# Patient Record
Sex: Female | Born: 2000
Health system: Southern US, Community
[De-identification: ages and names within clinical notes are randomized; demographics above are authoritative.]

## PROBLEM LIST (undated history)

## (undated) DIAGNOSIS — F419 Anxiety disorder, unspecified: Secondary | ICD-10-CM

## (undated) DIAGNOSIS — R519 Headache, unspecified: Secondary | ICD-10-CM

## (undated) DIAGNOSIS — R51 Headache: Secondary | ICD-10-CM

---
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
## 2019-10-22 ENCOUNTER — Other Ambulatory Visit: Payer: Self-pay

## 2019-10-22 DIAGNOSIS — Z20822 Contact with and (suspected) exposure to covid-19: Secondary | ICD-10-CM

## 2019-10-23 LAB — NOVEL CORONAVIRUS, NAA

## 2019-11-09 ENCOUNTER — Emergency Department (HOSPITAL_BASED_OUTPATIENT_CLINIC_OR_DEPARTMENT_OTHER)
Admission: EM | Admit: 2019-11-09 | Discharge: 2019-11-09 | Disposition: A | Discharge status: Home / Self Care | Payer: No Typology Code available for payment source | Attending: Emergency Medicine | Admitting: Emergency Medicine

## 2019-11-09 ENCOUNTER — Encounter (HOSPITAL_BASED_OUTPATIENT_CLINIC_OR_DEPARTMENT_OTHER): Payer: Self-pay

## 2019-11-09 ENCOUNTER — Emergency Department (HOSPITAL_BASED_OUTPATIENT_CLINIC_OR_DEPARTMENT_OTHER): Payer: No Typology Code available for payment source

## 2019-11-09 ENCOUNTER — Other Ambulatory Visit: Payer: Self-pay

## 2019-11-09 DIAGNOSIS — Z79899 Other long term (current) drug therapy: Secondary | ICD-10-CM | POA: Insufficient documentation

## 2019-11-09 DIAGNOSIS — R112 Nausea with vomiting, unspecified: Secondary | ICD-10-CM | POA: Diagnosis present

## 2019-11-09 DIAGNOSIS — K2900 Acute gastritis without bleeding: Secondary | ICD-10-CM | POA: Diagnosis not present

## 2019-11-09 DIAGNOSIS — R1084 Generalized abdominal pain: Secondary | ICD-10-CM

## 2019-11-09 LAB — POCT I-STAT EG7
Acid-Base Excess: 1 mmol/L (ref 0.0–2.0)
Bicarbonate: 23 mmol/L (ref 20.0–28.0)
Calcium, Ion: 1.24 mmol/L (ref 1.15–1.40)
HCT: 40 % (ref 36.0–46.0)
Hemoglobin: 13.6 g/dL (ref 12.0–15.0)
O2 Saturation: 54 %
Potassium: 3.1 mmol/L — ABNORMAL LOW (ref 3.5–5.1)
Sodium: 141 mmol/L (ref 135–145)
TCO2: 24 mmol/L (ref 22–32)
pCO2, Ven: 29.9 mmHg — ABNORMAL LOW (ref 44.0–60.0)
pH, Ven: 7.494 — ABNORMAL HIGH (ref 7.250–7.430)
pO2, Ven: 26 mmHg — CL (ref 32.0–45.0)

## 2019-11-09 LAB — LACTIC ACID, PLASMA: Lactic Acid, Venous: 1.3 mmol/L (ref 0.5–1.9)

## 2019-11-09 LAB — BASIC METABOLIC PANEL
Anion gap: 10 (ref 5–15)
BUN: 6 mg/dL (ref 6–20)
CO2: 24 mmol/L (ref 22–32)
Calcium: 8.7 mg/dL — ABNORMAL LOW (ref 8.9–10.3)
Chloride: 104 mmol/L (ref 98–111)
Creatinine, Ser: 0.59 mg/dL (ref 0.44–1.00)
GFR calc Af Amer: >60 mL/min (ref >60)
GFR calc non Af Amer: >60 mL/min (ref >60)
Glucose, Bld: 90 mg/dL (ref 70–99)
Potassium: 3.5 mmol/L (ref 3.5–5.1)
Sodium: 138 mmol/L (ref 135–145)

## 2019-11-09 LAB — COMPREHENSIVE METABOLIC PANEL
ALT: 17 U/L (ref 0–44)
AST: 19 U/L (ref 15–41)
Albumin: 4.8 g/dL (ref 3.5–5.0)
Alkaline Phosphatase: 49 U/L (ref 38–126)
Anion gap: 12 (ref 5–15)
BUN: 7 mg/dL (ref 6–20)
CO2: 23 mmol/L (ref 22–32)
Calcium: 9.7 mg/dL (ref 8.9–10.3)
Chloride: 104 mmol/L (ref 98–111)
Creatinine, Ser: 0.76 mg/dL (ref 0.44–1.00)
GFR calc Af Amer: >60 mL/min (ref >60)
GFR calc non Af Amer: >60 mL/min (ref >60)
Glucose, Bld: 109 mg/dL — ABNORMAL HIGH (ref 70–99)
Potassium: 3.1 mmol/L — ABNORMAL LOW (ref 3.5–5.1)
Sodium: 139 mmol/L (ref 135–145)
Total Bilirubin: 0.8 mg/dL (ref 0.3–1.2)
Total Protein: 7.9 g/dL (ref 6.5–8.1)

## 2019-11-09 LAB — CBC WITH DIFFERENTIAL/PLATELET
Abs Immature Granulocytes: 0.03 10*3/uL (ref 0.00–0.07)
Basophils Absolute: 0 10*3/uL (ref 0.0–0.1)
Basophils Relative: 0 %
Eosinophils Absolute: 0 10*3/uL (ref 0.0–0.5)
Eosinophils Relative: 0 %
HCT: 38.5 % (ref 36.0–46.0)
Hemoglobin: 13.4 g/dL (ref 12.0–15.0)
Immature Granulocytes: 0 %
Lymphocytes Relative: 11 %
Lymphs Abs: 1.1 10*3/uL (ref 0.7–4.0)
MCH: 30.7 pg (ref 26.0–34.0)
MCHC: 34.8 g/dL (ref 30.0–36.0)
MCV: 88.1 fL (ref 80.0–100.0)
Monocytes Absolute: 0.2 10*3/uL (ref 0.1–1.0)
Monocytes Relative: 2 %
Neutro Abs: 8.6 10*3/uL — ABNORMAL HIGH (ref 1.7–7.7)
Neutrophils Relative %: 87 %
Platelets: 243 10*3/uL (ref 150–400)
RBC: 4.37 MIL/uL (ref 3.87–5.11)
RDW: 11.5 % (ref 11.5–15.5)
WBC: 10 10*3/uL (ref 4.0–10.5)
nRBC: 0 % (ref 0.0–0.2)

## 2019-11-09 LAB — URINALYSIS, MICROSCOPIC (REFLEX)

## 2019-11-09 LAB — RAPID URINE DRUG SCREEN, HOSP PERFORMED
Amphetamines: NOT DETECTED
Barbiturates: NOT DETECTED
Benzodiazepines: NOT DETECTED
Cocaine: NOT DETECTED
Opiates: NOT DETECTED
Tetrahydrocannabinol: POSITIVE — AB

## 2019-11-09 LAB — URINALYSIS, ROUTINE W REFLEX MICROSCOPIC
Bilirubin Urine: NEGATIVE
Glucose, UA: NEGATIVE mg/dL
Ketones, ur: >80 mg/dL — AB
Leukocytes,Ua: NEGATIVE
Nitrite: NEGATIVE
Protein, ur: NEGATIVE mg/dL
Specific Gravity, Urine: 1.02 (ref 1.005–1.030)
pH: 7.5 (ref 5.0–8.0)

## 2019-11-09 LAB — MAGNESIUM: Magnesium: 1.7 mg/dL (ref 1.7–2.4)

## 2019-11-09 LAB — LIPASE, BLOOD: Lipase: 23 U/L (ref 11–51)

## 2019-11-09 LAB — GROUP A STREP BY PCR: Group A Strep by PCR: NOT DETECTED

## 2019-11-09 LAB — TSH: TSH: 0.711 u[IU]/mL (ref 0.350–4.500)

## 2019-11-09 LAB — HCG, QUANTITATIVE, PREGNANCY: hCG, Beta Chain, Quant, S: <1 m[IU]/mL (ref <5)

## 2019-11-09 LAB — PHOSPHORUS: Phosphorus: 3.6 mg/dL (ref 2.5–4.6)

## 2019-11-09 MED ORDER — CAPSAICIN 0.025 % EX CREA
TOPICAL_CREAM | Freq: Once | CUTANEOUS | Status: AC
  Administered 2019-11-09: 1 via TOPICAL
  Filled 2019-11-09: qty 60

## 2019-11-09 MED ORDER — LACTATED RINGERS IV BOLUS
2000.0000 mL | Freq: Once | INTRAVENOUS | Status: AC
  Administered 2019-11-09: 2000 mL via INTRAVENOUS

## 2019-11-09 MED ORDER — IOHEXOL 300 MG/ML  SOLN
100.0000 mL | Freq: Once | INTRAMUSCULAR | Status: AC | PRN
  Administered 2019-11-09: 100 mL via INTRAVENOUS

## 2019-11-09 MED ORDER — POTASSIUM PHOSPHATES 15 MMOLE/5ML IV SOLN
15.0000 mmol | Freq: Once | INTRAVENOUS | Status: DC
  Filled 2019-11-09: qty 5

## 2019-11-09 MED ORDER — FAMOTIDINE 20 MG PO TABS: 20.0000 mg | ORAL_TABLET | Freq: Two times a day (BID) | ORAL | 0 refills | Status: DC

## 2019-11-09 MED ORDER — DIPHENHYDRAMINE HCL 50 MG/ML IJ SOLN
25.0000 mg | Freq: Once | INTRAMUSCULAR | Status: AC
  Administered 2019-11-09: 12:00:00 25 mg via INTRAVENOUS
  Filled 2019-11-09: qty 1

## 2019-11-09 MED ORDER — SODIUM CHLORIDE 0.9 % IV SOLN: INTRAVENOUS | Status: DC | PRN

## 2019-11-09 MED ORDER — K PHOS MONO-SOD PHOS DI & MONO 155-852-130 MG PO TABS
500.0000 mg | ORAL_TABLET | Freq: Once | ORAL | Status: DC
  Filled 2019-11-09: qty 2

## 2019-11-09 MED ORDER — PROCHLORPERAZINE EDISYLATE 10 MG/2ML IJ SOLN
10.0000 mg | Freq: Once | INTRAMUSCULAR | Status: AC
  Administered 2019-11-09: 10 mg via INTRAVENOUS
  Filled 2019-11-09: qty 2

## 2019-11-09 MED ORDER — POTASSIUM CHLORIDE 10 MEQ/100ML IV SOLN
10.0000 meq | Freq: Once | INTRAVENOUS | Status: AC
  Administered 2019-11-09: 10 meq via INTRAVENOUS
  Filled 2019-11-09: qty 100

## 2019-11-09 MED FILL — FAMOTIDINE 20 MG TABS: 20 | 15 days supply | Qty: 30 | Fill #0

## 2019-11-09 NOTE — ED Provider Notes (Signed)
Pt was signed out by Dr. Johnney Killian.   CT abd/pelvis:  IMPRESSION: 1. Question mild thickening of the gastric antrum and GE junction, may represent esophagitis/gastritis. 2. Diffuse colonic thickening, colon is under distended limiting assessment. Findings may represent colitis, based on distribution infectious or inflammatory process is considered. Correlate with any recent antibiotic administration or with any history that would suggest inflammatory bowel disease. 3. Trace free fluid in the pelvis, nonspecific.  Pt has not had any diarrhea, just n/v.  She is now tolerating po fluids.  She has compazine and phenergan at home.  She has a GI appt on 12/21.  She will be put on pepcid to help with gastritis.  She is instructed to avoid mj.  Her electrolytes have resolved with IVFs. ? Lab error as phos was so low and now nl. KPhos was ordered, but it was never given.  Pt is stable for d/c.  Return if worse.   Theresa Pence, MD 11/09/19 1710

## 2019-11-09 NOTE — ED Notes (Signed)
ED Provider at bedside. 

## 2019-11-09 NOTE — ED Notes (Signed)
Patient transported to CT 

## 2019-11-09 NOTE — ED Triage Notes (Signed)
Pt states that she has been vomiting since Sat. She reports being seen at [facility] regional for same states she was given compazine and phenergan with no changes.

## 2019-11-09 NOTE — Discharge Instructions (Signed)
Keep your GI appointment as scheduled.  Do not use marijuana.

## 2019-11-09 NOTE — ED Provider Notes (Signed)
[facility] [facility] EMERGENCY DEPARTMENT Provider Note   CSN: [id number] Arrival date & time: 11/09/19  1110     History No chief complaint on file.   Theresa Potter is a 18 y.o. female.  HPI Patient reports she has had nausea vomiting and dry heaves for 6 days.  She reports on Saturday she was seen [facility].  At that time she reports she did have some degree of abdominal pain in conjunction with intense vomiting.  She denies he had any fever at any point.  She reports some occasional small amounts of diarrhea.  She reports she was treated and discharged and then subsequently seen again on Tuesday.  She reports that she was rehydrated and treated with Compazine.  Compazine has been somewhat helpful with the symptoms.  She reports no diagnosis was identified.  She however has continued now over the past 2 days to have intense dry heaves and vomiting.  She denies that she has any abdominal pain but has intense nausea and discomfort from dry heaving.  No fever.  No cough, no chest pain no shortness of breath.  Patient reports she had Covid testing at the health department on Monday which was negative.  No known exposures.  She reports on occasion in the past she has had some gastroenteritis but never anything like this.  Patient does have occasional marijuana use estimated a few times per week.  Last use reported prior to onset of symptoms.  None since.  No prior diagnosis of cannabinoid hyperemesis.  Denies any other drugs of abuse or alcohol use.  Patient denies sexual activity, not on birthcontrol.     Past Medical History:  Diagnosis Date  . Anxiety   . Headache     Patient Active Problem List   Diagnosis Date Noted  . MDD (major depressive disorder), recurrent severe, without psychosis (HCC) 01/02/2015  . Social anxiety disorder 01/02/2015    No past surgical history on file.   OB History   No obstetric history on file.     No family history on  file.  Social History   Tobacco Use  . Smoking status: Light Tobacco Smoker  . Smokeless tobacco: Never Used  Substance Use Topics  . Alcohol use: No    Comment: on occasion THC  . Drug use: Yes    Types: Marijuana    Home Medications Prior to Admission medications   Medication Sig Start Date End Date Taking? Authorizing Provider  ibuprofen (ADVIL,MOTRIN) 200 MG tablet Take 2 tablets (400 mg total) by mouth every 6 (six) hours as needed for headache. 01/08/15   Chauncey MannJennings, Glenn E, MD  mirtazapine (REMERON) 7.5 MG tablet Take 1 tablet (7.5 mg total) by mouth at bedtime. 01/08/15   Chauncey MannJennings, Glenn E, MD  venlafaxine XR (EFFEXOR-XR) 75 MG 24 hr capsule Take 3 capsules (225 mg total) by mouth daily. 01/08/15   Chauncey MannJennings, Glenn E, MD    Allergies    Patient has no known allergies.  Review of Systems   Review of Systems 10 Systems reviewed and are negative for acute change except as noted in the HPI.  Physical Exam Updated Vital Signs There were no vitals taken for this visit.  Physical Exam Constitutional:      Comments: Patient is pale and uncomfortable in appearance.  Alert with clear mental status.  No respiratory distress.  HENT:     Head: Normocephalic and atraumatic.     Nose: Nose normal.  Mouth/Throat:     Comments: Mucous membranes are moist.  Posterior oropharynx has some erythema on the tonsillar pillars bilaterally.  No tonsillar hypertrophy or exudate. Eyes:     Extraocular Movements: Extraocular movements intact.     Conjunctiva/sclera: Conjunctivae normal.     Pupils: Pupils are equal, round, and reactive to light.  Cardiovascular:     Rate and Rhythm: Normal rate and regular rhythm.  Pulmonary:     Effort: Pulmonary effort is normal.     Breath sounds: Normal breath sounds.  Abdominal:     Comments: Abdomen is soft.  No guarding no rebound no distention.  Patient does report a palpation exacerbates sensation of nausea.  Musculoskeletal:        General: No  swelling or tenderness. Normal range of motion.     Right lower leg: No edema.     Left lower leg: No edema.  Skin:    General: Skin is warm and dry.     Coloration: Skin is pale.  Neurological:     General: No focal deficit present.     Mental Status: She is oriented to person, place, and time.     Cranial Nerves: No cranial nerve deficit.     Motor: No weakness.     Coordination: Coordination normal.  Psychiatric:        Mood and Affect: Mood normal.        Behavior: Behavior normal.     ED Results / Procedures / Treatments   Labs (all labs ordered are listed, but only abnormal results are displayed) Labs Reviewed - No data to display  EKG None  Radiology No results found.  Procedures Procedures (including critical care time)  Medications Ordered in ED Medications - No data to display  ED Course  I have reviewed the triage vital signs and the nursing notes.  Pertinent labs & imaging results that were available during my care of the patient were reviewed by me and considered in my medical decision making (see chart for details).  Clinical Course as of Nov 08 1499  Fri Nov 09, 2019  1459 Recheck: Patient symptoms are completely resolved by Compazine, Benadryl and capsaicin cream with hydration.  She is alert and in no distress.  We will proceed with CT abdomen as patient is having recurrent symptoms over a 6-day duration now.   [MP]    Clinical Course User Index [MP] Arby Barrette, MD   MDM Rules/Calculators/A&P                     Patient presents with recurrent nausea and vomiting over the past 6 days approximately.  She had 2 episodes of emergency department treatment at [facility] hospital.  Will obtain lab work and initiate hydration and nausea treatment with Compazine, Benadryl and apply capsaicin cream to the abdomen.  Patient has had good response to the treatment.  She is very comfortable and vomiting has stopped.  Patient is severely  hypophosphatemic and has hypokalemia.  Phosphorus replacement will need to be transported from [facility] facility as it is not available in pharmacy at this time.  Will initiate potassium replacement and continue hydration.  We will proceed with CT scan given patient's recurrent symptoms over the past week.  I did have extensive conversation via FaceTime with the patient's mother and the patient in the room.  Differential continues to include cannabinoid hyperemesis.  I discussed the plan to continue work-up with CT scan, hydration and anticipated  recheck of electrolytes.  If electrolytes have normalized, patient may be appropriate for discharge.  She does have follow-up with GI on Monday.  Dr. Gilford Raid will follow up on CT results and repeat potassium and phosphorus after hydration.  If symptoms recur or electrolytes remain significantly deranged, may require admission for intractable vomiting. Final Clinical Impression(s) / ED Diagnoses Final diagnoses:  None    Rx / DC Orders ED Discharge Orders    None       Charlesetta Shanks, MD 11/09/19 [phone]

## 2019-11-21 LAB — PHOSPHORUS: Phosphorus: <1 mg/dL — CL (ref 2.5–4.6)

## 2021-01-08 IMAGING — CT CT ABD-PELV W/ CM
2 of 4 series · 16 of 46 positions shown, 18 images · IV contrast (Omnipaque)
Comparison: None.

CLINICAL DATA: Nausea vomiting.

EXAM:
CT ABDOMEN AND PELVIS WITH CONTRAST
TECHNIQUE: Multidetector CT imaging of the abdomen and pelvis was performed
using the standard protocol following bolus administration of
intravenous contrast.
CONTRAST:  100mL OMNIPAQUE IOHEXOL 300 MG/ML  SOLN

[Series 2: axial st · axial · 0.71mm/px · z∈[-440,-24]mm · 13 of 91 slices shown, 15 images]
[im 4/91  soft-tissue]
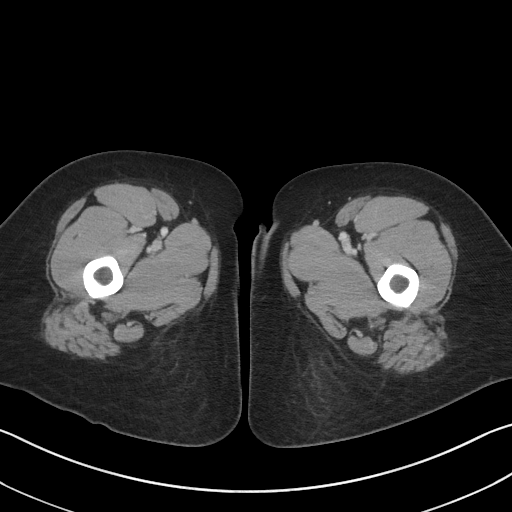
[im 4/91  bone]
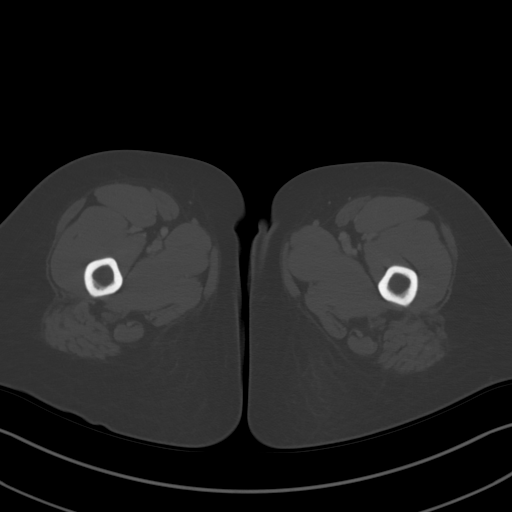
[im 12/91  soft-tissue]
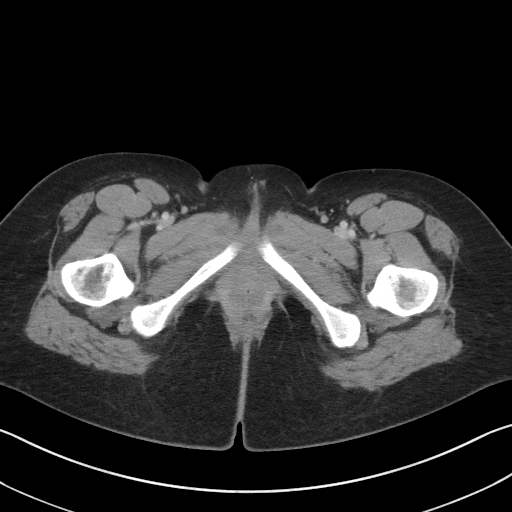
[im 19/91  soft-tissue]
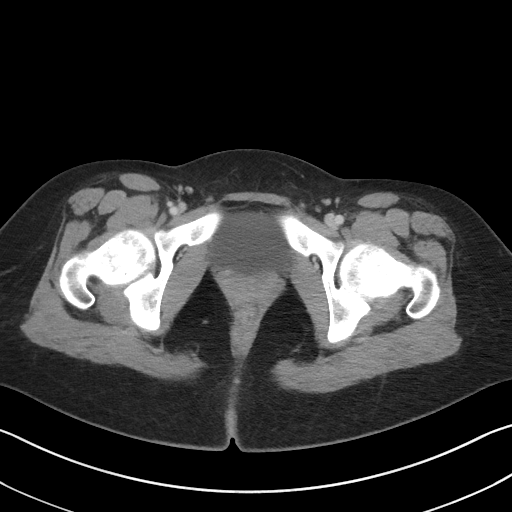
[im 27/91  soft-tissue]
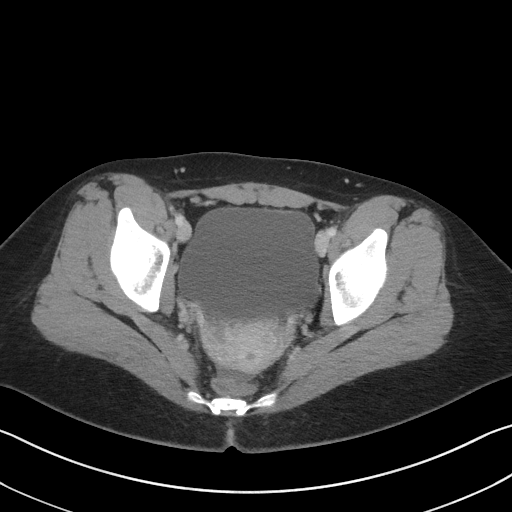
[im 31/91  soft-tissue]
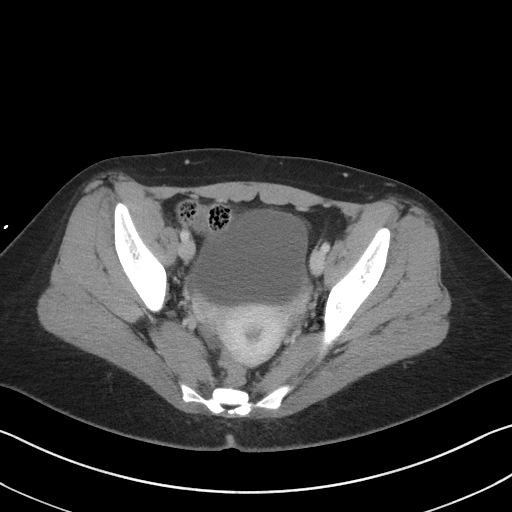
[im 38/91  soft-tissue]
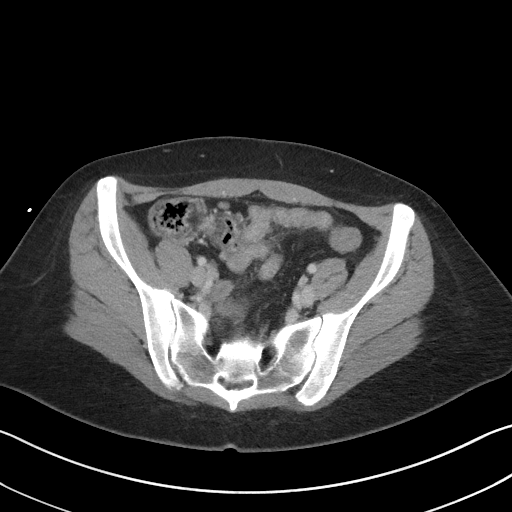
[im 46/91  soft-tissue]
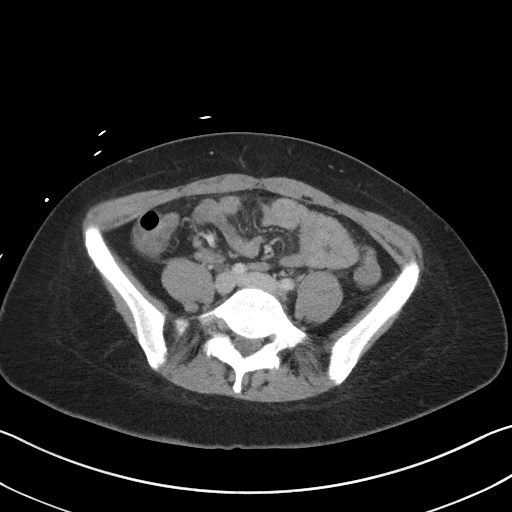
[im 53/91  soft-tissue]
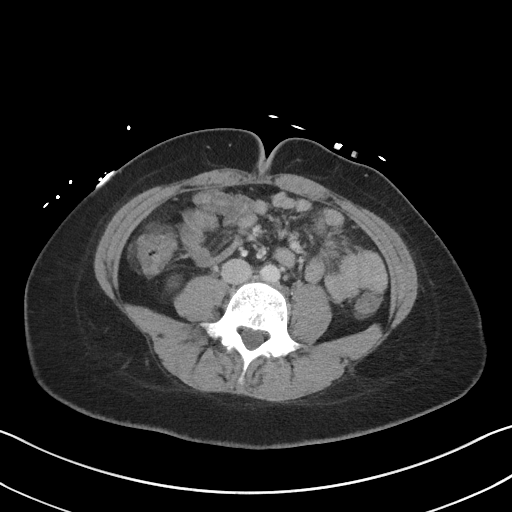
[im 61/91  soft-tissue]
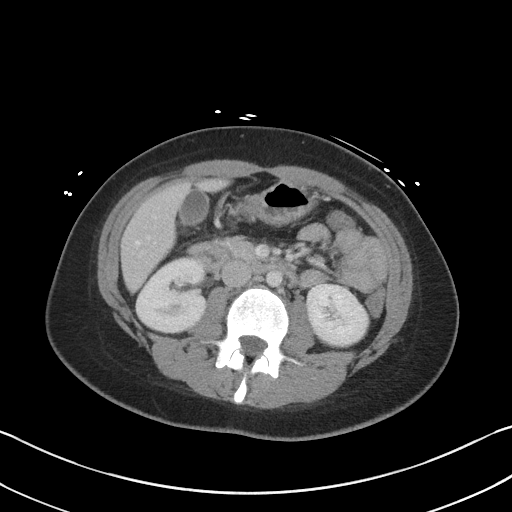
[im 61/91  bone]
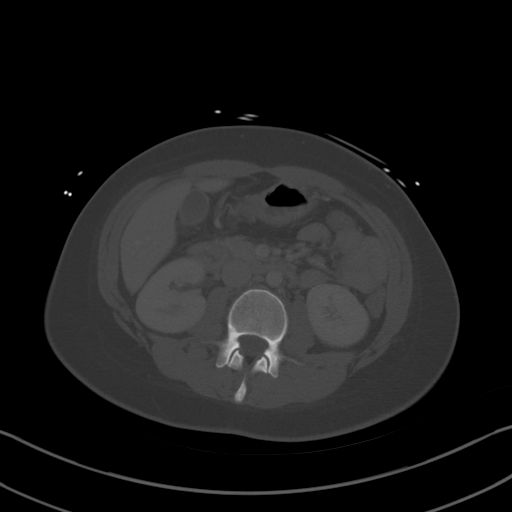
[im 64/91  soft-tissue]
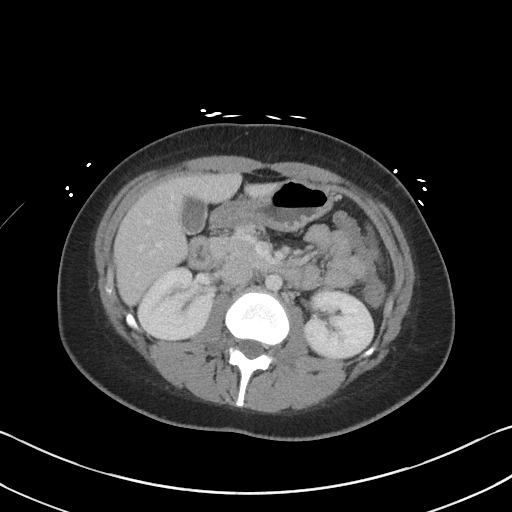
[im 72/91  soft-tissue]
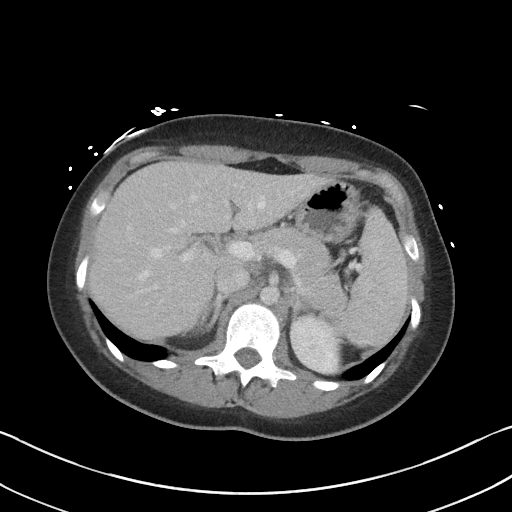
[im 79/91  soft-tissue]
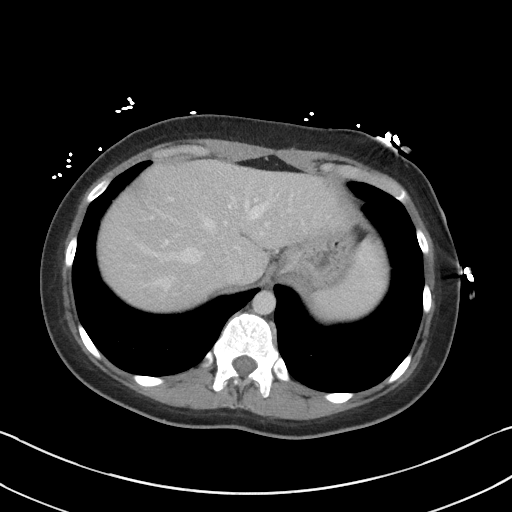
[im 87/91  soft-tissue]
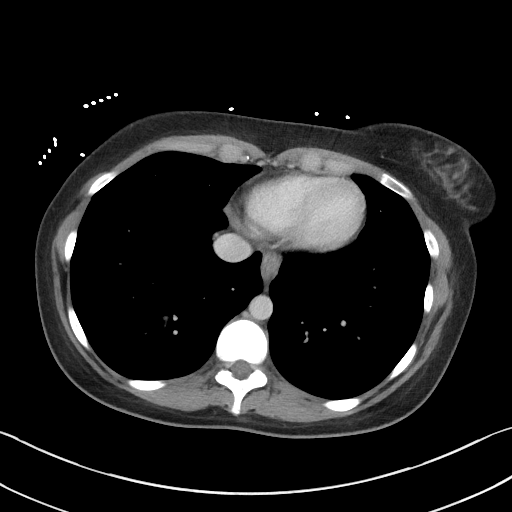

[Series 5: coronal st · coronal · 0.84mm/px · 3 of 72 slices shown]
[im 24/72  soft-tissue]
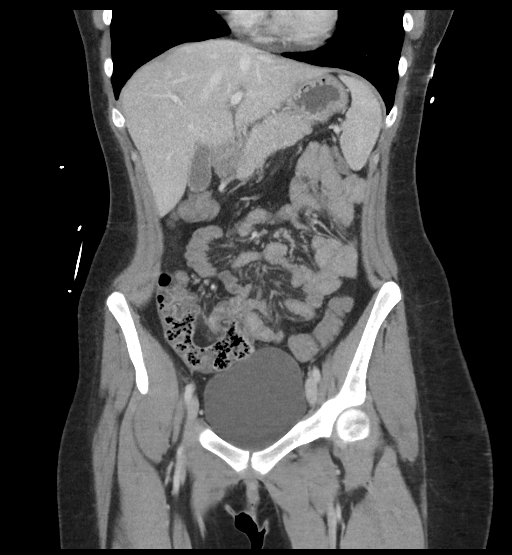
[im 32/72  soft-tissue]
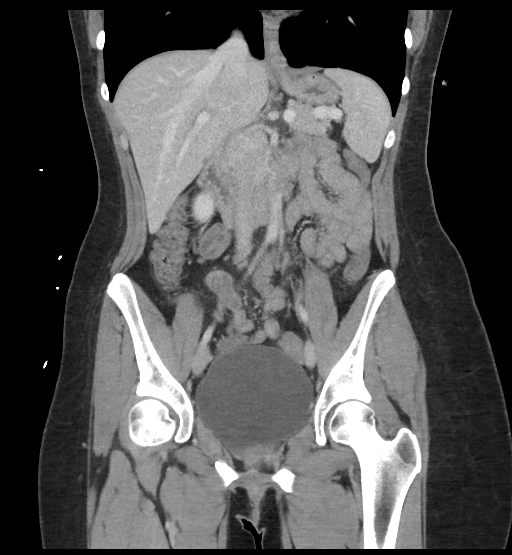
[im 40/72  soft-tissue]
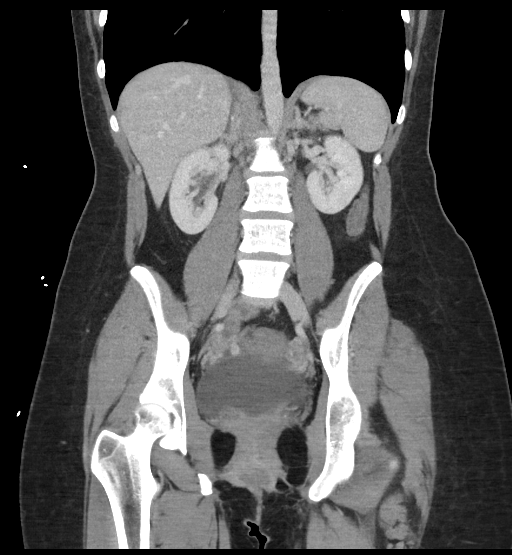

[16 of 46 positions shown; findings below may reference images not displayed]

FINDINGS: Lower chest: Incidental imaging of the lung bases is normal.

Hepatobiliary: Liver is normal with patent portal vein. Small amount
of sludge in the dependent gallbladder, no biliary ductal
distension.

Pancreas: Pancreas without focal lesion or ductal distension.

Spleen: Spleen is normal.

Adrenals/Urinary Tract: Adrenal glands are normal.

Kidneys enhance symmetrically. Urinary bladder is normal.

Stomach/Bowel: Diffuse colonic thickening, colon is under distended
limiting assessment. No pericolonic stranding at this time. Question
mild thickening of the gastric antrum and GE junction.

No signs of acute small bowel process.

Vascular/Lymphatic: Patent abdominal vasculature. No signs of
atherosclerosis. No signs of adenopathy.

No signs of pelvic lymphadenopathy.

Reproductive: Uterus and adnexa unremarkable by CT.

Other: Trace free fluid in the pelvis, nonspecific.

Musculoskeletal: No acute or significant osseous findings.
IMPRESSION: 1. Question mild thickening of the gastric antrum and GE junction,
may represent esophagitis/gastritis.
2. Diffuse colonic thickening, colon is under distended limiting
assessment. Findings may represent colitis, based on distribution
infectious or inflammatory process is considered. Correlate with any
recent antibiotic administration or with any history that would
suggest inflammatory bowel disease.
3. Trace free fluid in the pelvis, nonspecific.

## 2022-06-26 ENCOUNTER — Other Ambulatory Visit: Payer: Self-pay

## 2022-06-26 ENCOUNTER — Emergency Department (HOSPITAL_BASED_OUTPATIENT_CLINIC_OR_DEPARTMENT_OTHER): Payer: Medicaid Other

## 2022-06-26 ENCOUNTER — Encounter (HOSPITAL_BASED_OUTPATIENT_CLINIC_OR_DEPARTMENT_OTHER): Payer: Self-pay | Admitting: Emergency Medicine

## 2022-06-26 ENCOUNTER — Emergency Department (HOSPITAL_BASED_OUTPATIENT_CLINIC_OR_DEPARTMENT_OTHER)
Admission: EM | Admit: 2022-06-26 | Discharge: 2022-06-26 | Disposition: A | Discharge status: Hospice | Payer: Medicaid Other | Attending: Emergency Medicine | Admitting: Emergency Medicine

## 2022-06-26 DIAGNOSIS — R109 Unspecified abdominal pain: Secondary | ICD-10-CM | POA: Diagnosis present

## 2022-06-26 DIAGNOSIS — K529 Noninfective gastroenteritis and colitis, unspecified: Secondary | ICD-10-CM | POA: Insufficient documentation

## 2022-06-26 LAB — LIPASE, BLOOD: Lipase: 28 U/L (ref 11–51)

## 2022-06-26 LAB — URINALYSIS, ROUTINE W REFLEX MICROSCOPIC
Bilirubin Urine: NEGATIVE
Glucose, UA: NEGATIVE mg/dL
Hgb urine dipstick: NEGATIVE
Ketones, ur: NEGATIVE mg/dL
Leukocytes,Ua: NEGATIVE
Nitrite: NEGATIVE
Protein, ur: NEGATIVE mg/dL
Specific Gravity, Urine: 1.02 (ref 1.005–1.030)
pH: 7.5 (ref 5.0–8.0)

## 2022-06-26 LAB — COMPREHENSIVE METABOLIC PANEL
ALT: 12 U/L (ref 0–44)
AST: 18 U/L (ref 15–41)
Albumin: 4.2 g/dL (ref 3.5–5.0)
Alkaline Phosphatase: 55 U/L (ref 38–126)
Anion gap: 6 (ref 5–15)
BUN: 7 mg/dL (ref 6–20)
CO2: 26 mmol/L (ref 22–32)
Calcium: 9.2 mg/dL (ref 8.9–10.3)
Chloride: 105 mmol/L (ref 98–111)
Creatinine, Ser: 0.74 mg/dL (ref 0.44–1.00)
GFR, Estimated: >60 mL/min (ref >60)
Glucose, Bld: 96 mg/dL (ref 70–99)
Potassium: 4 mmol/L (ref 3.5–5.1)
Sodium: 137 mmol/L (ref 135–145)
Total Bilirubin: 0.6 mg/dL (ref 0.3–1.2)
Total Protein: 8.2 g/dL — ABNORMAL HIGH (ref 6.5–8.1)

## 2022-06-26 LAB — CBC
HCT: 40.1 % (ref 36.0–46.0)
Hemoglobin: 13.6 g/dL (ref 12.0–15.0)
MCH: 30.8 pg (ref 26.0–34.0)
MCHC: 33.9 g/dL (ref 30.0–36.0)
MCV: 90.9 fL (ref 80.0–100.0)
Platelets: 313 10*3/uL (ref 150–400)
RBC: 4.41 MIL/uL (ref 3.87–5.11)
RDW: 12.1 % (ref 11.5–15.5)
WBC: 11 10*3/uL — ABNORMAL HIGH (ref 4.0–10.5)
nRBC: 0 % (ref 0.0–0.2)

## 2022-06-26 LAB — PREGNANCY, URINE: Preg Test, Ur: NEGATIVE

## 2022-06-26 MED ORDER — METRONIDAZOLE 500 MG PO TABS
500.0000 mg | ORAL_TABLET | Freq: Once | ORAL | Status: AC
  Administered 2022-06-26: 500 mg via ORAL
  Filled 2022-06-26: qty 1

## 2022-06-26 MED ORDER — METRONIDAZOLE 500 MG PO TABS: 500.0000 mg | ORAL_TABLET | Freq: Two times a day (BID) | ORAL | 0 refills | Status: DC

## 2022-06-26 MED ORDER — HYDROCODONE-ACETAMINOPHEN 5-325 MG PO TABS
1.0000 | ORAL_TABLET | Freq: Once | ORAL | Status: AC
  Administered 2022-06-26: 1 via ORAL
  Filled 2022-06-26: qty 1

## 2022-06-26 MED ORDER — MORPHINE SULFATE (PF) 4 MG/ML IV SOLN
4.0000 mg | INTRAVENOUS | Status: DC | PRN
  Administered 2022-06-26: 4 mg via INTRAVENOUS
  Filled 2022-06-26: qty 1

## 2022-06-26 MED ORDER — SODIUM CHLORIDE 0.9 % IV BOLUS
1000.0000 mL | Freq: Once | INTRAVENOUS | Status: AC
  Administered 2022-06-26: 1000 mL via INTRAVENOUS

## 2022-06-26 MED ORDER — CIPROFLOXACIN HCL 500 MG PO TABS
500.0000 mg | ORAL_TABLET | Freq: Two times a day (BID) | ORAL | 0 refills | Status: DC
Start: 2022-06-26 — End: 2023-09-21

## 2022-06-26 MED ORDER — MORPHINE SULFATE (PF) 4 MG/ML IV SOLN: 8.0000 mg | Freq: Once | INTRAVENOUS | Status: DC

## 2022-06-26 MED ORDER — IOHEXOL 300 MG/ML  SOLN
100.0000 mL | Freq: Once | INTRAMUSCULAR | Status: AC | PRN
  Administered 2022-06-26: 100 mL via INTRAVENOUS

## 2022-06-26 MED ORDER — CIPROFLOXACIN HCL 500 MG PO TABS
500.0000 mg | ORAL_TABLET | Freq: Once | ORAL | Status: AC
  Administered 2022-06-26: 500 mg via ORAL
  Filled 2022-06-26: qty 1

## 2022-06-26 MED ORDER — ONDANSETRON HCL 4 MG/2ML IJ SOLN
4.0000 mg | Freq: Four times a day (QID) | INTRAMUSCULAR | Status: DC | PRN
  Administered 2022-06-26: 4 mg via INTRAVENOUS
  Filled 2022-06-26: qty 2

## 2022-06-26 NOTE — ED Provider Notes (Signed)
[facility] EMERGENCY DEPARTMENT Provider Note   CSN: [id number] Arrival date & time: 06/26/22  1726     History  Chief Complaint  Patient presents with   Abdominal Pain    Theresa Potter is a 21 y.o. female.  HPI     22 yo F w/o past medical hx presents to ED for RLQ abd pain x3 days. Patient said it first occurred in her sleep, unsure of what triggered it. The pain was coming and going but has been constant for the past day. Describes pain as dull ache that becomes sharp when she moves. Pain relieved by rest. Denies taking any meds for pain. She endorses nausea and diarrhea but no vomiting. Last Bm was this morning, brown and loose. Denies sick contacts. Denies dysuria or hematuria. Currently sexually active not on [facility] and not using barrier method w/ one partner. LMP was last week of July 24/25. Denies abn/ bloody vaginal discharge. Denies STI risk.   Home Medications Prior to Admission medications   Medication Sig Start Date End Date Taking? Authorizing Provider  ciprofloxacin (CIPRO) 500 MG tablet Take 1 tablet (500 mg total) by mouth 2 (two) times daily. 06/26/22  Yes Derwood Kaplan, MD  metroNIDAZOLE (FLAGYL) 500 MG tablet Take 1 tablet (500 mg total) by mouth 2 (two) times daily. 06/26/22  Yes Derwood Kaplan, MD  famotidine (PEPCID) 20 MG tablet Take 1 tablet (20 mg total) by mouth 2 (two) times daily. 11/09/19   Jacalyn Lefevre, MD  ibuprofen (ADVIL,MOTRIN) 200 MG tablet Take 2 tablets (400 mg total) by mouth every 6 (six) hours as needed for headache. 01/08/15   Chauncey Mann, MD  mirtazapine (REMERON) 7.5 MG tablet Take 1 tablet (7.5 mg total) by mouth at bedtime. 01/08/15   Chauncey Mann, MD  prochlorperazine (COMPAZINE) 10 MG tablet Take by mouth. 11/06/19   [provider]  promethazine (PHENERGAN) 25 MG suppository Place rectally. 11/06/19 11/13/19  [provider]  venlafaxine XR (EFFEXOR-XR) 75 MG 24 hr capsule Take 3 capsules (225 mg  total) by mouth daily. 01/08/15   Chauncey Mann, MD      Allergies    Patient has no known allergies.    Review of Systems   Review of Systems  All other systems reviewed and are negative.   Physical Exam Updated Vital Signs BP 91/75   Pulse 90   Temp 98.3 F (36.8 C) (Oral)   Resp 17   Ht 5\' 4"  (1.626 m)   Wt 79.4 kg   LMP 06/14/2022   SpO2 100%   BMI 30.04 kg/m  Physical Exam Vitals and nursing note reviewed.  Constitutional:      Appearance: She is well-developed.  HENT:     Head: Atraumatic.  Cardiovascular:     Rate and Rhythm: Normal rate.  Pulmonary:     Effort: Pulmonary effort is normal.  Abdominal:     Tenderness: There is abdominal tenderness. There is guarding. Positive signs include Rovsing's sign and McBurney's sign.  Musculoskeletal:     Cervical back: Normal range of motion and neck supple.  Skin:    General: Skin is warm and dry.  Neurological:     Mental Status: She is alert and oriented to person, place, and time.     ED Results / Procedures / Treatments   Labs (all labs ordered are listed, but only abnormal results are displayed) Labs Reviewed  COMPREHENSIVE METABOLIC PANEL - Abnormal; Notable for the following components:  Result Value   Total Protein 8.2 (*)    All other components within normal limits  CBC - Abnormal; Notable for the following components:   WBC 11.0 (*)    All other components within normal limits  LIPASE, BLOOD  URINALYSIS, ROUTINE W REFLEX MICROSCOPIC  PREGNANCY, URINE    EKG None  Radiology CT ABDOMEN PELVIS W CONTRAST  Result Date: 06/26/2022 CLINICAL DATA:  RLQ abdominal pain (Age >= 14y) EXAM: CT ABDOMEN AND PELVIS WITH CONTRAST TECHNIQUE: Multidetector CT imaging of the abdomen and pelvis was performed using the standard protocol following bolus administration of intravenous contrast. RADIATION DOSE REDUCTION: This exam was performed according to the departmental dose-optimization program which  includes automated exposure control, adjustment of the mA and/or kV according to patient size and/or use of iterative reconstruction technique. CONTRAST:  OMNIPAQUE IOHEXOL 300 MG/ML  SOLN COMPARISON:  November 09, 2019 FINDINGS: Lower chest: No acute abnormality. Hepatobiliary: Focal fatty deposition adjacent to the falciform ligament. Gallbladder is unremarkable. No intrahepatic or extrahepatic biliary ductal dilation. Pancreas: Unremarkable. No pancreatic ductal dilatation or surrounding inflammatory changes. Spleen: Normal in size without focal abnormality. Adrenals/Urinary Tract: Adrenal glands are unremarkable. Several punctate nonobstructive LEFT-sided nephrolithiasis. Kidneys enhance symmetrically. No obstructing nephrolithiasis. Bladder is unremarkable Stomach/Bowel: No evidence of bowel obstruction. There is focal mucosal hyperenhancement and mild bowel wall thickening of the terminal ileum (series 5, image 51; series 2, image 65; series 6, image 55). The upstream ileum is mildly patulous in appearance. The appendix is normal. Stomach is unremarkable. Vascular/Lymphatic: There is a prominent mesenteric lymph node in the RIGHT lower quadrant which measures 8 mm (series 5, image 54). Vasculature is unremarkable. Reproductive: Uterus is unremarkable. Crenulated RIGHT ovarian cyst, likely corpus luteum (for which no dedicated imaging follow-up is recommended) . Other: No free air.  Small volume pelvic free fluid. Musculoskeletal: No acute or significant osseous findings. IMPRESSION: 1. There is focal bowel wall thickening and mucosal enhancement of the terminal ileum with mildly patulous appearance of the upstream ileum and a prominent adjacent mesenteric lymph node. Findings are favored to reflect terminal ileitis which could be due to an underlying inflammatory bowel disease. Recommend correlation with clinical history and laboratory values. 2. Normal appendix. 3. RIGHT-sided corpus luteum which could  reflect a source of pain in the appropriate clinical setting. Electronically Signed   By: Meda Klinefelter M.D.   On: 06/26/2022 19:51    Procedures Procedures    Medications Ordered in ED Medications  morphine (PF) 4 MG/ML injection 8 mg (0 mg Intravenous Hold 06/26/22 1906)  ondansetron (ZOFRAN) injection 4 mg (4 mg Intravenous Given 06/26/22 1901)  morphine (PF) 4 MG/ML injection 4 mg (4 mg Intravenous Given 06/26/22 1904)  sodium chloride 0.9 % bolus 1,000 mL (0 mLs Intravenous Stopped 06/26/22 2043)  iohexol (OMNIPAQUE) 300 MG/ML solution 100 mL (100 mLs Intravenous Contrast Given 06/26/22 1929)  ciprofloxacin (CIPRO) tablet 500 mg (500 mg Oral Given 06/26/22 2146)  metroNIDAZOLE (FLAGYL) tablet 500 mg (500 mg Oral Given 06/26/22 2146)  HYDROcodone-acetaminophen (NORCO/VICODIN) 5-325 MG per tablet 1 tablet (1 tablet Oral Given 06/26/22 2146)    ED Course/ Medical Decision Making/ A&P Clinical Course as of 06/26/22 2206  Sat Jun 26, 2022  2050 CT ABDOMEN PELVIS W CONTRAST CT scan shows terminal ileitis.  About 2 years ago, patient had seen GI doctor at outside hospital.  At that time she had upper GI study done.  CT scan at that time had shown diffuse colitis.  She denies any family history of IBD.  Patient does not have a PCP.  We will consult GI to see if they recommend close GI follow-up for this terminal ileitis. [AN]    Clinical Course User Index [AN] Derwood Kaplan, MD                           Medical Decision Making Amount and/or Complexity of Data Reviewed Labs: ordered. Radiology: ordered. Decision-making details documented in ED Course.  Risk Prescription drug management.   This patient presents to the ED with chief complaint(s) of abdominal pain for the last 3 days with no significant past medical history.  Patient denies any associated vaginal discharge, bleeding.  The differential diagnosis includes : Acute appendicitis, PID, STI, TOA, ovarian torsion, ectopic  pregnancy  The initial plan is to get basic labs and CT abdomen pelvis with contrast.   Independent labs interpretation:  The following labs were independently interpreted: CBC shows slightly elevated white count.  Normal renal function.  Independent visualization of imaging: - I independently visualized the following imaging with scope of interpretation limited to determining acute life threatening conditions related to emergency care: CT abdomen pelvis, which revealed no evidence of perforation.  There is some inflammation in the right lower quadrant.  Radiology read indicates that there is no appendicitis, patient has terminal ileitis.  Treatment and Reassessment: Results of the ER work-up discussed with the patient.  She indicates that she has had colitis in the past and has seen GI doctor before.  There is no family history of IBD.  Patient denies any bloody stools, but she is having diarrhea.  Consultation: - Consulted or discussed management/test interpretation w/ external professional: I discussed the case with GI doctor, Dr. Levora Angel.  He recommends that patient be started on Cipro and Flagyl for the ileitis, and be provided with GI follow-up.  Patient likely will need a scope if there is any heighten suspicion for Crohn's/IBD.Marland Kitchen   Final Clinical Impression(s) / ED Diagnoses Final diagnoses:  Ileitis    Rx / DC Orders ED Discharge Orders          Ordered    ciprofloxacin (CIPRO) 500 MG tablet  2 times daily        06/26/22 2141    metroNIDAZOLE (FLAGYL) 500 MG tablet  2 times daily        06/26/22 2141              Derwood Kaplan, MD 06/26/22 2206

## 2022-06-26 NOTE — ED Triage Notes (Signed)
Pt arrives pov, slow gait, c/o RLQ pain x 3 days. Endorses nausea and diarrhea

## 2022-06-26 NOTE — ED Notes (Signed)
Patient transported to CT 

## 2022-06-26 NOTE — Discharge Instructions (Addendum)
We saw you in the emergency room for abdominal pain.  CT scan is showing that you have ileitis  Antibiotics have not prescribed for this condition.  There is no evidence of acute appendicitis.  We recommend that you follow-up with a GI doctor for further assessment to see if you need colonoscopy.  Please follow-up with your GI doctor by calling them or following up with our GI doctor.

## 2023-09-21 ENCOUNTER — Ambulatory Visit: Payer: 59 | Admitting: Nurse Practitioner

## 2023-09-21 VITALS — BP 102/74 | HR 92 | Temp 98.5°F | Ht 64.0 in | Wt 215.0 lb

## 2023-09-21 DIAGNOSIS — Z23 Encounter for immunization: Secondary | ICD-10-CM

## 2023-09-21 DIAGNOSIS — F419 Anxiety disorder, unspecified: Secondary | ICD-10-CM

## 2023-09-21 DIAGNOSIS — Z6836 Body mass index (BMI) 36.0-36.9, adult: Secondary | ICD-10-CM

## 2023-09-21 DIAGNOSIS — E66812 Obesity, class 2: Secondary | ICD-10-CM | POA: Diagnosis not present

## 2023-09-21 DIAGNOSIS — R5383 Other fatigue: Secondary | ICD-10-CM

## 2023-09-21 LAB — VITAMIN D 25 HYDROXY (VIT D DEFICIENCY, FRACTURES): VITD: 19.43 ng/mL — ABNORMAL LOW (ref 30.00–100.00)

## 2023-09-21 LAB — COMPREHENSIVE METABOLIC PANEL
ALT: 14 U/L (ref 0–35)
AST: 15 U/L (ref 0–37)
Albumin: 4.4 g/dL (ref 3.5–5.2)
Alkaline Phosphatase: 60 U/L (ref 39–117)
BUN: 10 mg/dL (ref 6–23)
CO2: 29 meq/L (ref 19–32)
Calcium: 9.6 mg/dL (ref 8.4–10.5)
Chloride: 101 meq/L (ref 96–112)
Creatinine, Ser: 0.85 mg/dL (ref 0.40–1.20)
GFR: 96.68 mL/min (ref >60.00)
Glucose, Bld: 93 mg/dL (ref 70–99)
Potassium: 4.4 meq/L (ref 3.5–5.1)
Sodium: 138 meq/L (ref 135–145)
Total Bilirubin: 0.5 mg/dL (ref 0.2–1.2)
Total Protein: 7.7 g/dL (ref 6.0–8.3)

## 2023-09-21 LAB — LIPID PANEL
Cholesterol: 165 mg/dL (ref 0–200)
HDL: 55.1 mg/dL (ref >39.00)
LDL Cholesterol: 98 mg/dL (ref 0–99)
NonHDL: 109.41
Total CHOL/HDL Ratio: 3
Triglycerides: 59 mg/dL (ref 0.0–149.0)
VLDL: 11.8 mg/dL (ref 0.0–40.0)

## 2023-09-21 LAB — CBC
HCT: 40.3 % (ref 36.0–46.0)
Hemoglobin: 13.1 g/dL (ref 12.0–15.0)
MCHC: 32.6 g/dL (ref 30.0–36.0)
MCV: 92.8 fL (ref 78.0–100.0)
Platelets: 298 10*3/uL (ref 150.0–400.0)
RBC: 4.34 Mil/uL (ref 3.87–5.11)
RDW: 13.2 % (ref 11.5–15.5)
WBC: 7.7 10*3/uL (ref 4.0–10.5)

## 2023-09-21 LAB — HEMOGLOBIN A1C: Hgb A1c MFr Bld: 5.3 % (ref 4.6–6.5)

## 2023-09-21 LAB — VITAMIN B12: Vitamin B-12: 279 pg/mL (ref 211–911)

## 2023-09-21 LAB — TSH: TSH: 2.57 u[IU]/mL (ref 0.35–5.50)

## 2023-09-21 NOTE — Assessment & Plan Note (Signed)
Chronic Labs ordered, further recommendations may be made based upon these results.

## 2023-09-21 NOTE — Assessment & Plan Note (Signed)
Chronic, suboptimally controlled Check labs today for further evaluation.  Follow-up in 1 month at which point we can discuss treatment options if no other causes noted in lab work results.

## 2023-09-21 NOTE — Progress Notes (Signed)
New Patient Office Visit  Subjective    Patient ID: Eman Puder, female    DOB: [birth year]  Age: 22 y.o. MRN: [id number]  CC:  Chief Complaint  Patient presents with   Anxiety    HPI Rosi Caffee presents to establish care She has not been to a PCP regularly since seeing her pediatrician.  She reports that over the last year she has gotten sick multiple times which has been concerning for her.  Mainly she has had strep throat 3 times this past year.  She also mentions a few months ago where she woke up from sleep with severe nausea and then vomiting most of the night.  This ended up subsiding within the next day.  She also reports that she has history of anxiety and feels that it has been a bit more exacerbated lately.  She usually will use coping mechanisms such as grounding that help to control her anxiety.  She would like to discuss possibly starting medication again for this.  Denies suicidal ideation today.  In the past reports having been on Remeron, Lexapro, Effexor, Prozac with Lexapro having the best effect/tolerability.  Outpatient Encounter Medications as of 09/21/2023  Medication Sig   [DISCONTINUED] ciprofloxacin (CIPRO) 500 MG tablet Take 1 tablet (500 mg total) by mouth 2 (two) times daily.   [DISCONTINUED] famotidine (PEPCID) 20 MG tablet Take 1 tablet (20 mg total) by mouth 2 (two) times daily.   [DISCONTINUED] ibuprofen (ADVIL,MOTRIN) 200 MG tablet Take 2 tablets (400 mg total) by mouth every 6 (six) hours as needed for headache.   [DISCONTINUED] metroNIDAZOLE (FLAGYL) 500 MG tablet Take 1 tablet (500 mg total) by mouth 2 (two) times daily.   [DISCONTINUED] mirtazapine (REMERON) 7.5 MG tablet Take 1 tablet (7.5 mg total) by mouth at bedtime.   [DISCONTINUED] prochlorperazine (COMPAZINE) 10 MG tablet Take by mouth.   [DISCONTINUED] promethazine (PHENERGAN) 25 MG suppository Place rectally.   [DISCONTINUED] venlafaxine XR (EFFEXOR-XR) 75 MG 24 hr capsule Take 3 capsules  (225 mg total) by mouth daily.   No facility-administered encounter medications on file as of 09/21/2023.    Past Medical History:  Diagnosis Date   Anxiety    Headache     No past surgical history on file.  No family history on file.  Social History   Socioeconomic History   Marital status: Single    Spouse name: Not on file   Number of children: Not on file   Years of education: Not on file   Highest education level: Not on file  Occupational History   Not on file  Tobacco Use   Smoking status: Light Smoker   Smokeless tobacco: Never  Substance and Sexual Activity   Alcohol use: Yes    Comment: on occasion THC   Drug use: Not Currently    Types: Marijuana   Sexual activity: Never  Other Topics Concern   Not on file  Social History Narrative   Not on file   Social Determinants of Health   Financial Resource Strain: Not on file  Food Insecurity: Not on file  Transportation Needs: Not on file  Physical Activity: Not on file  Stress: Not on file  Social Connections: Not on file  Intimate Partner Violence: Not on file    Review of Systems  Constitutional:  Positive for malaise/fatigue. Negative for chills and fever.  Respiratory:  Negative for cough, shortness of breath and wheezing.   Cardiovascular:  Negative for chest pain and palpitations.  Gastrointestinal:  Negative for blood in stool.  Genitourinary:  Negative for hematuria.  Psychiatric/Behavioral:  Positive for depression. Negative for suicidal ideas. The patient is nervous/anxious and has insomnia.         Objective    BP 102/74   Pulse 92   Temp 98.5 F (36.9 C) (Temporal)   Ht 5\' 4"  (1.626 m)   Wt 215 lb (97.5 kg)   LMP 09/06/2023   SpO2 97%   BMI 36.90 kg/m   Physical Exam Vitals reviewed.  Constitutional:      General: She is not in acute distress.    Appearance: Normal appearance.  HENT:     Head: Normocephalic and atraumatic.  Neck:     Vascular: No carotid bruit.   Cardiovascular:     Rate and Rhythm: Normal rate and regular rhythm.     Pulses: Normal pulses.     Heart sounds: Normal heart sounds.  Pulmonary:     Effort: Pulmonary effort is normal.     Breath sounds: Normal breath sounds.  Skin:    General: Skin is warm and dry.  Neurological:     General: No focal deficit present.     Mental Status: She is alert and oriented to person, place, and time.  Psychiatric:        Mood and Affect: Mood normal.        Behavior: Behavior normal.        Judgment: Judgment normal.         Assessment & Plan:   Problem List Items Addressed This Visit       Other   Other fatigue    Chronic Labs ordered, further recommendations may be made based upon these results       Relevant Orders   CBC   Comprehensive metabolic panel   Hemoglobin A1c   Lipid panel   TSH   VITAMIN D 25 Hydroxy (Vit-D Deficiency, Fractures)   Vitamin B12   Class 2 obesity without serious comorbidity with body mass index (BMI) of 36.0 to 36.9 in adult    Chronic Labs ordered, further recommendations may be made based upon these results       Relevant Orders   CBC   Comprehensive metabolic panel   Hemoglobin A1c   Lipid panel   TSH   VITAMIN D 25 Hydroxy (Vit-D Deficiency, Fractures)   Vitamin B12   Need for vaccination    Discussed flu shot today.  Patient would like to have flu shot administered.  Flu shot administered, VIS provided.      Relevant Orders   Flu vaccine trivalent PF, 6mos and older(Flulaval,Afluria,Fluarix,Fluzone) (Completed)   Anxiety - Primary    Chronic, suboptimally controlled Check labs today for further evaluation.  Follow-up in 1 month at which point we can discuss treatment options if no other causes noted in lab work results.       Return in about 1 month (around 10/22/2023) for CPE with Anner Baity.   Elenore Paddy, NP

## 2023-09-21 NOTE — Assessment & Plan Note (Signed)
Discussed flu shot today.  Patient would like to have flu shot administered.  Flu shot administered, VIS provided.

## 2023-11-11 ENCOUNTER — Ambulatory Visit (INDEPENDENT_AMBULATORY_CARE_PROVIDER_SITE_OTHER): Payer: 59 | Admitting: Nurse Practitioner

## 2023-11-11 VITALS — BP 96/60 | HR 86 | Temp 98.5°F | Ht 64.0 in | Wt 211.5 lb

## 2023-11-11 DIAGNOSIS — E559 Vitamin D deficiency, unspecified: Secondary | ICD-10-CM | POA: Insufficient documentation

## 2023-11-11 DIAGNOSIS — Z0001 Encounter for general adult medical examination with abnormal findings: Secondary | ICD-10-CM | POA: Insufficient documentation

## 2023-11-11 DIAGNOSIS — Z0000 Encounter for general adult medical examination without abnormal findings: Secondary | ICD-10-CM | POA: Diagnosis not present

## 2023-11-11 DIAGNOSIS — Z1159 Encounter for screening for other viral diseases: Secondary | ICD-10-CM | POA: Insufficient documentation

## 2023-11-11 DIAGNOSIS — F419 Anxiety disorder, unspecified: Secondary | ICD-10-CM | POA: Diagnosis not present

## 2023-11-11 DIAGNOSIS — Z23 Encounter for immunization: Secondary | ICD-10-CM

## 2023-11-11 DIAGNOSIS — Z124 Encounter for screening for malignant neoplasm of cervix: Secondary | ICD-10-CM | POA: Insufficient documentation

## 2023-11-11 MED ORDER — ESCITALOPRAM OXALATE 10 MG PO TABS: 10.0000 mg | ORAL_TABLET | Freq: Every day | ORAL | 1 refills | Status: DC

## 2023-11-11 NOTE — Assessment & Plan Note (Signed)
Tetanus vaccine administered, VIS provided.

## 2023-11-11 NOTE — Progress Notes (Signed)
Complete physical exam  Patient: Theresa Potter   DOB: Sep 29, 2001   22 y.o. Female  MRN: [id number]  Subjective:    Chief Complaint  Patient presents with   Annual Exam    Theresa Potter is a 22 y.o. female who presents today for a complete physical exam. She reports consuming a general diet.  Exercise: pilates 3x/week - 50 minutes each.  She generally feels well. She reports sleeping fairly well. She does have additional problems to discuss today.   Health maintenance: Due for tetanus vaccine.  Due for Pap smear.  Declines hep C and STI screening today.  Would be open to hep C screening next time labs are drawn.  Flu shot administered at last office visit.  Anxiety/vitamin B12/vitamin D deficiency: Continues to experience anxiety.  Reports having been on Lexapro, Prozac, and Remeron in the past.  Reports Lexapro seem to be most efficacious in controlling her mood.  No suicidal ideation at this time.  Does have intermittent insomnia likely related to her anxiety.  Would like to consider restarting Lexapro today.  As part of workup at last office visit labs were drawn which did identify low normal B12 and vitamin D deficiency.  She has started a multivitamin.  Does report history of vegetarian diet but recently has started incorporating chicken and [place] in diet.   Most recent fall risk assessment:    11/11/2023    9:54 AM  Fall Risk   Falls in the past year? 0  Number falls in past yr: 0  Injury with Fall? 0  Risk for fall due to : No Fall Risks  Follow up Falls evaluation completed     Most recent depression screenings:    11/11/2023    9:54 AM 09/21/2023    8:04 AM  PHQ 2/9 Scores  PHQ - 2 Score 0 0    Vision:Within last year and Dental: No current dental problems and No regular dental care   Past Medical History:  Diagnosis Date   Anxiety    Headache    No past surgical history on file. Social History   Socioeconomic History   Marital status: Single    Spouse  name: Not on file   Number of children: Not on file   Years of education: Not on file   Highest education level: Not on file  Occupational History   Not on file  Tobacco Use   Smoking status: Light Smoker   Smokeless tobacco: Never  Substance and Sexual Activity   Alcohol use: Yes    Comment: on occasion THC   Drug use: Not Currently    Types: Marijuana   Sexual activity: Never  Other Topics Concern   Not on file  Social History Narrative   Not on file   Social Drivers of Health   Financial Resource Strain: Not on file  Food Insecurity: Not on file  Transportation Needs: Not on file  Physical Activity: Not on file  Stress: Not on file  Social Connections: Not on file  Intimate Partner Violence: Not on file   No Known Allergies    Patient Care Team: Elenore Paddy, NP as PCP - General (Nurse Practitioner)   Outpatient Medications Prior to Visit  Medication Sig   Multiple Vitamin (MULTIVITAMIN) capsule Take 1 capsule by mouth daily.   No facility-administered medications prior to visit.    Review of Systems  Constitutional:  Positive for malaise/fatigue. Negative for chills and fever.  HENT:  Negative for  hearing loss and tinnitus.   Eyes:  Negative for blurred vision and double vision.  Respiratory:  Negative for cough, shortness of breath and wheezing.   Cardiovascular:  Negative for chest pain and palpitations.  Gastrointestinal:  Negative for abdominal pain and blood in stool.  Genitourinary:  Negative for dysuria and hematuria.  Skin:  Negative for itching and rash.  Psychiatric/Behavioral:  Negative for suicidal ideas. The patient is nervous/anxious. The patient does not have insomnia.           Objective:     BP 96/60   Pulse 86   Temp 98.5 F (36.9 C) (Temporal)   Ht 5\' 4"  (1.626 m)   Wt 211 lb 8 oz (95.9 kg)   LMP 11/06/2023   SpO2 100%   BMI 36.30 kg/m  BP Readings from Last 3 Encounters:  11/11/23 96/60  09/21/23 102/74  06/26/22  91/75   Wt Readings from Last 3 Encounters:  11/11/23 211 lb 8 oz (95.9 kg)  09/21/23 215 lb (97.5 kg)  06/26/22 175 lb (79.4 kg)       Physical Exam Vitals reviewed. Exam conducted with a chaperone present.  Constitutional:      Appearance: Normal appearance.  HENT:     Head: Normocephalic and atraumatic.     Right Ear: Tympanic membrane, ear canal and external ear normal.     Left Ear: Tympanic membrane, ear canal and external ear normal.  Eyes:     General:        Right eye: No discharge.        Left eye: No discharge.     Extraocular Movements: Extraocular movements intact.     Conjunctiva/sclera: Conjunctivae normal.     Pupils: Pupils are equal, round, and reactive to light.  Neck:     Vascular: No carotid bruit.  Cardiovascular:     Rate and Rhythm: Normal rate and regular rhythm.     Pulses: Normal pulses.     Heart sounds: Normal heart sounds. No murmur heard. Pulmonary:     Effort: Pulmonary effort is normal.     Breath sounds: Normal breath sounds.  Chest:  Breasts:    Breasts are symmetrical.     Right: Normal.     Left: Normal.  Abdominal:     General: Abdomen is flat. Bowel sounds are normal. There is no distension.     Palpations: Abdomen is soft. There is no mass.     Tenderness: There is no abdominal tenderness.  Musculoskeletal:        General: No tenderness.     Cervical back: Neck supple. No muscular tenderness.     Right lower leg: No edema.     Left lower leg: No edema.  Lymphadenopathy:     Cervical: No cervical adenopathy.     Upper Body:     Right upper body: No supraclavicular adenopathy.     Left upper body: No supraclavicular adenopathy.  Skin:    General: Skin is warm and dry.  Neurological:     General: No focal deficit present.     Mental Status: She is alert and oriented to person, place, and time.     Motor: No weakness.     Gait: Gait normal.  Psychiatric:        Mood and Affect: Mood normal.        Behavior: Behavior  normal.        Judgment: Judgment normal.      No results  found for any visits on 11/11/23.     Assessment & Plan:    Routine Health Maintenance and Physical Exam  Immunization History  Administered Date(s) Administered   DTaP 08/18/2000, 10/19/2000, 12/21/2000, 06/20/2001, 06/10/2005   HIB, Unspecified 08/18/2000, 10/19/2000, 12/21/2000, 06/20/2001   Hepatitis B, PED/ADOLESCENT 07/05/2000, 08/18/2000, 03/21/2001   Hpv-Unspecified 12/16/2010, 02/17/2011, 12/21/2011   IPV 08/18/2000, 10/19/2000, 03/21/2001, 06/10/2005   Influenza, Seasonal, Injecte, Preservative Fre 09/21/2023   Influenza,inj,Quad PF,6+ Mos 01/08/2015   Influenza-Unspecified 01/14/2010, 12/16/2010, 12/01/2012, 01/08/2015   MMR 06/20/2001, 06/10/2005   Meningococcal Acwy, Unspecified 05/11/2013   Pneumococcal Conjugate PCV 7 08/18/2000, 10/19/2000, 03/21/2001, 06/20/2001   Tdap 12/16/2010   Varicella 06/20/2001, 04/14/2009    Health Maintenance  Topic Date Due   DTaP/Tdap/Td (7 - Td or Tdap) 12/16/2020   Cervical Cancer Screening (Pap smear)  Never done   COVID-19 Vaccine (1 - 2024-25 season) Never done   Hepatitis C Screening  11/10/2024 (Originally 06/19/2018)   HIV Screening  11/10/2024 (Originally 06/20/2015)   INFLUENZA VACCINE  Completed   HPV VACCINES  Completed    Discussed health benefits of physical activity, and encouraged her to engage in regular exercise appropriate for her age and condition.  Problem List Items Addressed This Visit       Other   Need for vaccination   Tetanus vaccine administered, VIS provided      Anxiety   Chronic, suboptimally controlled. Patient has started multivitamin which should help improve vitamin D level which may also help mood.  However I am agreeable to starting Lexapro as well.  Prescription for Lexapro 10 mg tablet sent to patient's pharmacy today, patient to follow-up in 4 to 6 weeks.      Relevant Medications   escitalopram (LEXAPRO) 10 MG tablet    Cervical cancer screening   Referral to OB/GYN made today.      Relevant Orders   Ambulatory referral to Obstetrics / Gynecology   Vitamin D deficiency   Chronic Patient has started multivitamin. Will check serum vitamin D level at next office visit.       Encounter for hepatitis C screening test for low risk patient   Consider screening for hepatitis C at next lab draw.      Encounter for general adult medical examination with abnormal findings - Primary   Discussed healthy lifestyle and screening recommendations.  Handout provided.      Return in about 6 weeks (around 12/23/2023) for F/U with Maralyn Sago.  In addition to performing her annual physical exam also performed an office visit as discussed above.     Elenore Paddy, NP

## 2023-11-11 NOTE — Assessment & Plan Note (Addendum)
Chronic Patient has started multivitamin. Will check serum vitamin D level at next office visit.

## 2023-11-11 NOTE — Assessment & Plan Note (Signed)
Discussed healthy lifestyle and screening recommendations. Handout provided.

## 2023-11-11 NOTE — Assessment & Plan Note (Signed)
Referral to OBGYN made today.  

## 2023-11-11 NOTE — Assessment & Plan Note (Signed)
Consider screening for hepatitis C at next lab draw.

## 2023-11-11 NOTE — Assessment & Plan Note (Signed)
Chronic, suboptimally controlled. Patient has started multivitamin which should help improve vitamin D level which may also help mood.  However I am agreeable to starting Lexapro as well.  Prescription for Lexapro 10 mg tablet sent to patient's pharmacy today, patient to follow-up in 4 to 6 weeks.

## 2023-12-23 ENCOUNTER — Ambulatory Visit (INDEPENDENT_AMBULATORY_CARE_PROVIDER_SITE_OTHER): Payer: 59 | Admitting: Nurse Practitioner

## 2023-12-23 VITALS — BP 106/62 | HR 97 | Temp 99.2°F | Ht 64.0 in | Wt 208.1 lb

## 2023-12-23 DIAGNOSIS — F419 Anxiety disorder, unspecified: Secondary | ICD-10-CM

## 2023-12-23 MED ORDER — ESCITALOPRAM OXALATE 20 MG PO TABS: 20.0000 mg | ORAL_TABLET | Freq: Every day | ORAL | 1 refills | Status: DC

## 2023-12-23 NOTE — Progress Notes (Signed)
   Established Patient Office Visit  Subjective   Patient ID: Theresa Potter, female    DOB: 2000/12/25  Age: 23 y.o. MRN: [id number]  Chief Complaint  Patient presents with   Anxiety    Anxiety: Patient started Lexapro 10 mg daily at last office visit.  She reports tolerating medication well.  She denies any suicidal or homicidal ideation today.  She reports that her mood has improved, but feels that anxiety is still occurring and is still does affect her sleep.  She also has noticed a bit of eye twitching but otherwise no other side effects.    ROS: see HPI    Objective:     BP 106/62   Pulse 97   Temp 99.2 F (37.3 C) (Temporal)   Ht 5\' 4"  (1.626 m)   Wt 208 lb 2 oz (94.4 kg)   LMP 12/08/2023   SpO2 96%   BMI 35.72 kg/m  BP Readings from Last 3 Encounters:  12/23/23 106/62  11/11/23 96/60  09/21/23 102/74   Wt Readings from Last 3 Encounters:  12/23/23 208 lb 2 oz (94.4 kg)  11/11/23 211 lb 8 oz (95.9 kg)  09/21/23 215 lb (97.5 kg)         12/23/2023    8:12 AM 11/11/2023    9:54 AM 09/21/2023    8:04 AM  PHQ9 SCORE ONLY  PHQ-9 Total Score 0 0 0     Physical Exam Vitals reviewed.  Constitutional:      General: She is not in acute distress.    Appearance: Normal appearance.  HENT:     Head: Normocephalic and atraumatic.  Neck:     Vascular: No carotid bruit.  Cardiovascular:     Rate and Rhythm: Normal rate and regular rhythm.     Pulses: Normal pulses.     Heart sounds: Normal heart sounds.  Pulmonary:     Effort: Pulmonary effort is normal.     Breath sounds: Normal breath sounds.  Skin:    General: Skin is warm and dry.  Neurological:     General: No focal deficit present.     Mental Status: She is alert and oriented to person, place, and time.  Psychiatric:        Mood and Affect: Mood normal.        Behavior: Behavior normal.        Judgment: Judgment normal.      No results found for any visits on 12/23/23.    The ASCVD Risk  score (Arnett DK, et al., 2019) failed to calculate for the following reasons:   The 2019 ASCVD risk score is only valid for ages 51 to 74    Assessment & Plan:   Problem List Items Addressed This Visit       Other   Anxiety - Primary   Chronic Per patient mood is improved, but anxiety is still not fully controlled. Per shared decision making will increase dose of lexapro to 20mg /day F/U in 6-8 weeks Call office if SI occurs, discussed importance of preventing pregnancy/discussing possible medication adjustments/changes prior to attempting to conceive. She reports her understanding.       Relevant Medications   escitalopram (LEXAPRO) 20 MG tablet    Return in about 8 weeks (around 02/17/2024) for F/U with Maralyn Sago.    Elenore Paddy, NP

## 2023-12-23 NOTE — Assessment & Plan Note (Signed)
Chronic Per patient mood is improved, but anxiety is still not fully controlled. Per shared decision making will increase dose of lexapro to 20mg /day F/U in 6-8 weeks Call office if SI occurs, discussed importance of preventing pregnancy/discussing possible medication adjustments/changes prior to attempting to conceive. She reports her understanding.

## 2024-02-17 ENCOUNTER — Ambulatory Visit: Payer: 59 | Admitting: Nurse Practitioner

## 2024-04-11 ENCOUNTER — Telehealth: Payer: Self-pay | Admitting: Nurse Practitioner

## 2024-04-11 DIAGNOSIS — F419 Anxiety disorder, unspecified: Secondary | ICD-10-CM

## 2024-04-11 NOTE — Telephone Encounter (Signed)
 Copied from CRM [id number]. Topic: Clinical - Prescription Issue >> Apr 11, 2024  2:23 PM Theresa Potter wrote: Reason for CRM: Patient called in regarding escitalopram  (LEXAPRO ) 20 MG tablet , stated she got a notification earlier that it was deny by her pharmacy    Patient would like a callback on the status

## 2024-04-13 NOTE — Telephone Encounter (Signed)
 Left detail message to pt about Lexapro  being send in to her pharmacy on 04/11/24, and per pharmacy notification they received rx at 10:45 am
